# Patient Record
Sex: Female | Born: 1965 | Race: Black or African American | Hispanic: No | Marital: Single | Smoking: Former smoker
Health system: Southern US, Community
[De-identification: ages and names within clinical notes are randomized; demographics above are authoritative.]

## PROBLEM LIST (undated history)

## (undated) DIAGNOSIS — G8929 Other chronic pain: Secondary | ICD-10-CM

## (undated) DIAGNOSIS — M5430 Sciatica, unspecified side: Secondary | ICD-10-CM

## (undated) DIAGNOSIS — F209 Schizophrenia, unspecified: Secondary | ICD-10-CM

## (undated) DIAGNOSIS — F4310 Post-traumatic stress disorder, unspecified: Secondary | ICD-10-CM

## (undated) DIAGNOSIS — F419 Anxiety disorder, unspecified: Secondary | ICD-10-CM

## (undated) DIAGNOSIS — R42 Dizziness and giddiness: Secondary | ICD-10-CM

## (undated) DIAGNOSIS — M549 Dorsalgia, unspecified: Secondary | ICD-10-CM

## (undated) DIAGNOSIS — J45909 Unspecified asthma, uncomplicated: Secondary | ICD-10-CM

## (undated) HISTORY — PX: REVISION OF ABDOMINAL SCAR: SHX2347

## (undated) HISTORY — PX: UMBILICAL HERNIA REPAIR: SHX196

---

## 2016-10-16 ENCOUNTER — Ambulatory Visit (HOSPITAL_COMMUNITY)
Admission: EM | Admit: 2016-10-16 | Discharge: 2016-10-16 | Disposition: A | Discharge status: Home / Self Care | Payer: Medicare (Managed Care) | Attending: Emergency Medicine | Admitting: Emergency Medicine

## 2016-10-16 ENCOUNTER — Encounter (HOSPITAL_COMMUNITY): Payer: Self-pay | Admitting: *Deleted

## 2016-10-16 ENCOUNTER — Ambulatory Visit (INDEPENDENT_AMBULATORY_CARE_PROVIDER_SITE_OTHER): Payer: Medicare (Managed Care)

## 2016-10-16 DIAGNOSIS — J40 Bronchitis, not specified as acute or chronic: Secondary | ICD-10-CM | POA: Diagnosis not present

## 2016-10-16 HISTORY — DX: Unspecified asthma, uncomplicated: J45.909

## 2016-10-16 HISTORY — DX: Anxiety disorder, unspecified: F41.9

## 2016-10-16 HISTORY — DX: Other chronic pain: G89.29

## 2016-10-16 HISTORY — DX: Post-traumatic stress disorder, unspecified: F43.10

## 2016-10-16 HISTORY — DX: Schizophrenia, unspecified: F20.9

## 2016-10-16 HISTORY — DX: Dorsalgia, unspecified: M54.9

## 2016-10-16 HISTORY — DX: Sciatica, unspecified side: M54.30

## 2016-10-16 HISTORY — DX: Dizziness and giddiness: R42

## 2016-10-16 MED ORDER — ALBUTEROL SULFATE HFA 108 (90 BASE) MCG/ACT IN AERS: 2.0000 | INHALATION_SPRAY | RESPIRATORY_TRACT | 0 refills | Status: AC | PRN

## 2016-10-16 MED ORDER — AZITHROMYCIN 250 MG PO TABS: ORAL_TABLET | ORAL | 0 refills | Status: AC

## 2016-10-16 MED ORDER — FLUTICASONE PROPIONATE HFA 110 MCG/ACT IN AERO: 2.0000 | INHALATION_SPRAY | Freq: Two times a day (BID) | RESPIRATORY_TRACT | 0 refills | Status: AC

## 2016-10-16 MED ORDER — PREDNISONE 20 MG PO TABS: ORAL_TABLET | ORAL | 0 refills | Status: AC

## 2016-10-16 MED ORDER — HYDROCOD POLST-CPM POLST ER 10-8 MG/5ML PO SUER: 5.0000 mL | Freq: Two times a day (BID) | ORAL | 0 refills | Status: AC | PRN

## 2016-10-16 NOTE — Discharge Instructions (Signed)
You have bronchitis. °Take azithromycin and prednisone as prescribed. °Use tussionex as needed for cough. °Use the albuterol every 4 hours as needed for wheezing or cough. °You should see improvement in the next 3-5 days. °If you develop fevers, difficulty breathing, or are just not getting better, please come back or go to the emergency room. ° °

## 2016-10-16 NOTE — ED Provider Notes (Signed)
[facility]    CSN: [id number] Arrival date & time: 10/16/16  1212     History   Chief Complaint Chief Complaint  Patient presents with  . Wheezing    HPI Jacqueline SleightUalania Niven is a 51 y.o. female.   HPI  She is a 51 year old woman here for evaluation of cough and wheezing. She reports an asthma exacerbation for the last week. She has completed a course of prednisone with minimal improvement. Over the last 12 hours, symptoms have worsened with increased cough, shortness of breath, body aches, and chills. She also reports sore throat. She has been using her inhalers without improvement. She also reports feeling dizzy, particularly after coughing. She has had some episodes of vomiting.  Past Medical History:  Diagnosis Date  . Anxiety   . Asthma   . Chronic back pain   . PTSD (post-traumatic stress disorder)   . Schizophrenia (HCC)   . Sciatica   . Vertigo     There are no active problems to display for this patient.   Past Surgical History:  Procedure Laterality Date  . REVISION OF ABDOMINAL SCAR    . UMBILICAL HERNIA REPAIR     x2    OB History    No data available       Home Medications    Prior to Admission medications   Medication Sig Start Date End Date Taking? Authorizing Provider  Acetaminophen-Codeine (TYLENOL WITH CODEINE #4 PO) Take by mouth.   Yes Historical Provider, MD  GABAPENTIN PO Take by mouth.   Yes Historical Provider, MD  HYDROCHLOROTHIAZIDE PO Take by mouth.   Yes Historical Provider, MD  LORAZEPAM PO Take by mouth.   Yes Historical Provider, MD  Naproxen (NAPROSYN PO) Take by mouth.   Yes Historical Provider, MD  QUEtiapine Fumarate (SEROQUEL PO) Take by mouth.   Yes Historical Provider, MD  albuterol (PROVENTIL HFA;VENTOLIN HFA) 108 (90 Base) MCG/ACT inhaler Inhale 2 puffs into the lungs every 4 (four) hours as needed for wheezing or shortness of breath. 10/16/16   Charm RingsErin J Yaakov Saindon, MD  azithromycin (ZITHROMAX Z-PAK) 250 MG tablet Take 2  pills today, then 1 pill daily until gone. 10/16/16   Charm RingsErin J Tasha Diaz, MD  chlorpheniramine-HYDROcodone (TUSSIONEX PENNKINETIC ER) 10-8 MG/5ML SUER Take 5 mLs by mouth every 12 (twelve) hours as needed for cough. 10/16/16   Charm RingsErin J Amandy Chubbuck, MD  fluticasone (FLOVENT HFA) 110 MCG/ACT inhaler Inhale 2 puffs into the lungs 2 (two) times daily. 10/16/16   Charm RingsErin J Katara Griner, MD  predniSONE (DELTASONE) 20 MG tablet Take 3 tablets for 5 days, then 2 for 3 days, then 1 for 3 days, then 1/2 for 3 days. 10/16/16   Charm RingsErin J Cristobal Advani, MD    Family History No family history on file.  Social History Social History  Substance Use Topics  . Smoking status: Former Games developermoker  . Smokeless tobacco: Not on file  . Alcohol use Yes     Comment: occasional     Allergies   Motrin [ibuprofen]   Review of Systems Review of Systems As in history of present illness  Physical Exam Triage Vital Signs ED Triage Vitals  Enc Vitals Group     BP 10/16/16 1333 117/72     Pulse Rate 10/16/16 1333 112     Resp 10/16/16 1333 26     Temp 10/16/16 1333 98.9 F (37.2 C)     Temp Source 10/16/16 1333 Oral     SpO2 10/16/16 1333 99 %  Weight --      Height --      Head Circumference --      Peak Flow --      Pain Score 10/16/16 1339 8     Pain Loc --      Pain Edu? --      Excl. in GC? --    No data found.   Updated Vital Signs BP 117/72   Pulse 112   Temp 98.9 F (37.2 C) (Oral)   Resp 26   LMP 09/30/2016 (Exact Date)   SpO2 99%   Visual Acuity Right Eye Distance:   Left Eye Distance:   Bilateral Distance:    Right Eye Near:   Left Eye Near:    Bilateral Near:     Physical Exam  Constitutional: She is oriented to person, place, and time. She appears well-developed and well-nourished. No distress.  HENT:  Mouth/Throat: Oropharynx is clear and moist. No oropharyngeal exudate.  Nasal mucosa erythematous  Neck: Neck supple.  Cardiovascular: Regular rhythm and normal heart sounds.   No murmur heard. Mild  tachycardia  Pulmonary/Chest: Effort normal and breath sounds normal. No respiratory distress. She has no wheezes. She has no rales.  Lymphadenopathy:    She has no cervical adenopathy.  Neurological: She is alert and oriented to person, place, and time.     UC Treatments / Results  Labs (all labs ordered are listed, but only abnormal results are displayed) Labs Reviewed - No data to display  EKG  EKG Interpretation None       Radiology No results found.  Procedures Procedures (including critical care time)  Medications Ordered in UC Medications - No data to display   Initial Impression / Assessment and Plan / UC Course  I have reviewed the triage vital signs and the nursing notes.  Pertinent labs & imaging results that were available during my care of the patient were reviewed by me and considered in my medical decision making (see chart for details).     We'll treat with azithromycin and prednisone taper. Tussionex as needed for cough. I have also provided refills of her inhalers. Follow-up as needed.  Final Clinical Impressions(s) / UC Diagnoses   Final diagnoses:  Bronchitis    New Prescriptions New Prescriptions   ALBUTEROL (PROVENTIL HFA;VENTOLIN HFA) 108 (90 BASE) MCG/ACT INHALER    Inhale 2 puffs into the lungs every 4 (four) hours as needed for wheezing or shortness of breath.   AZITHROMYCIN (ZITHROMAX Z-PAK) 250 MG TABLET    Take 2 pills today, then 1 pill daily until gone.   CHLORPHENIRAMINE-HYDROCODONE (TUSSIONEX PENNKINETIC ER) 10-8 MG/5ML SUER    Take 5 mLs by mouth every 12 (twelve) hours as needed for cough.   FLUTICASONE (FLOVENT HFA) 110 MCG/ACT INHALER    Inhale 2 puffs into the lungs 2 (two) times daily.   PREDNISONE (DELTASONE) 20 MG TABLET    Take 3 tablets for 5 days, then 2 for 3 days, then 1 for 3 days, then 1/2 for 3 days.     Charm Rings, MD 10/16/16 1434

## 2016-10-16 NOTE — ED Triage Notes (Signed)
Pt visiting from out of town.  Is currently finishing Prednisone taper for asthma.  Has been using albuterol and Flovent without any relief.  Approx 12 hrs ago started not feeling well with wheezing, burning in chest, chills.

## 2017-10-06 IMAGING — DX DG CHEST 2V
2 series · 2 of 2 positions shown · non-contrast
Comparison: None.

CLINICAL DATA: Cough, wheezing, low-grade fever, and chest pain for
3 weeks. Asthma.

EXAM:
CHEST  2 VIEW

[chest pa]
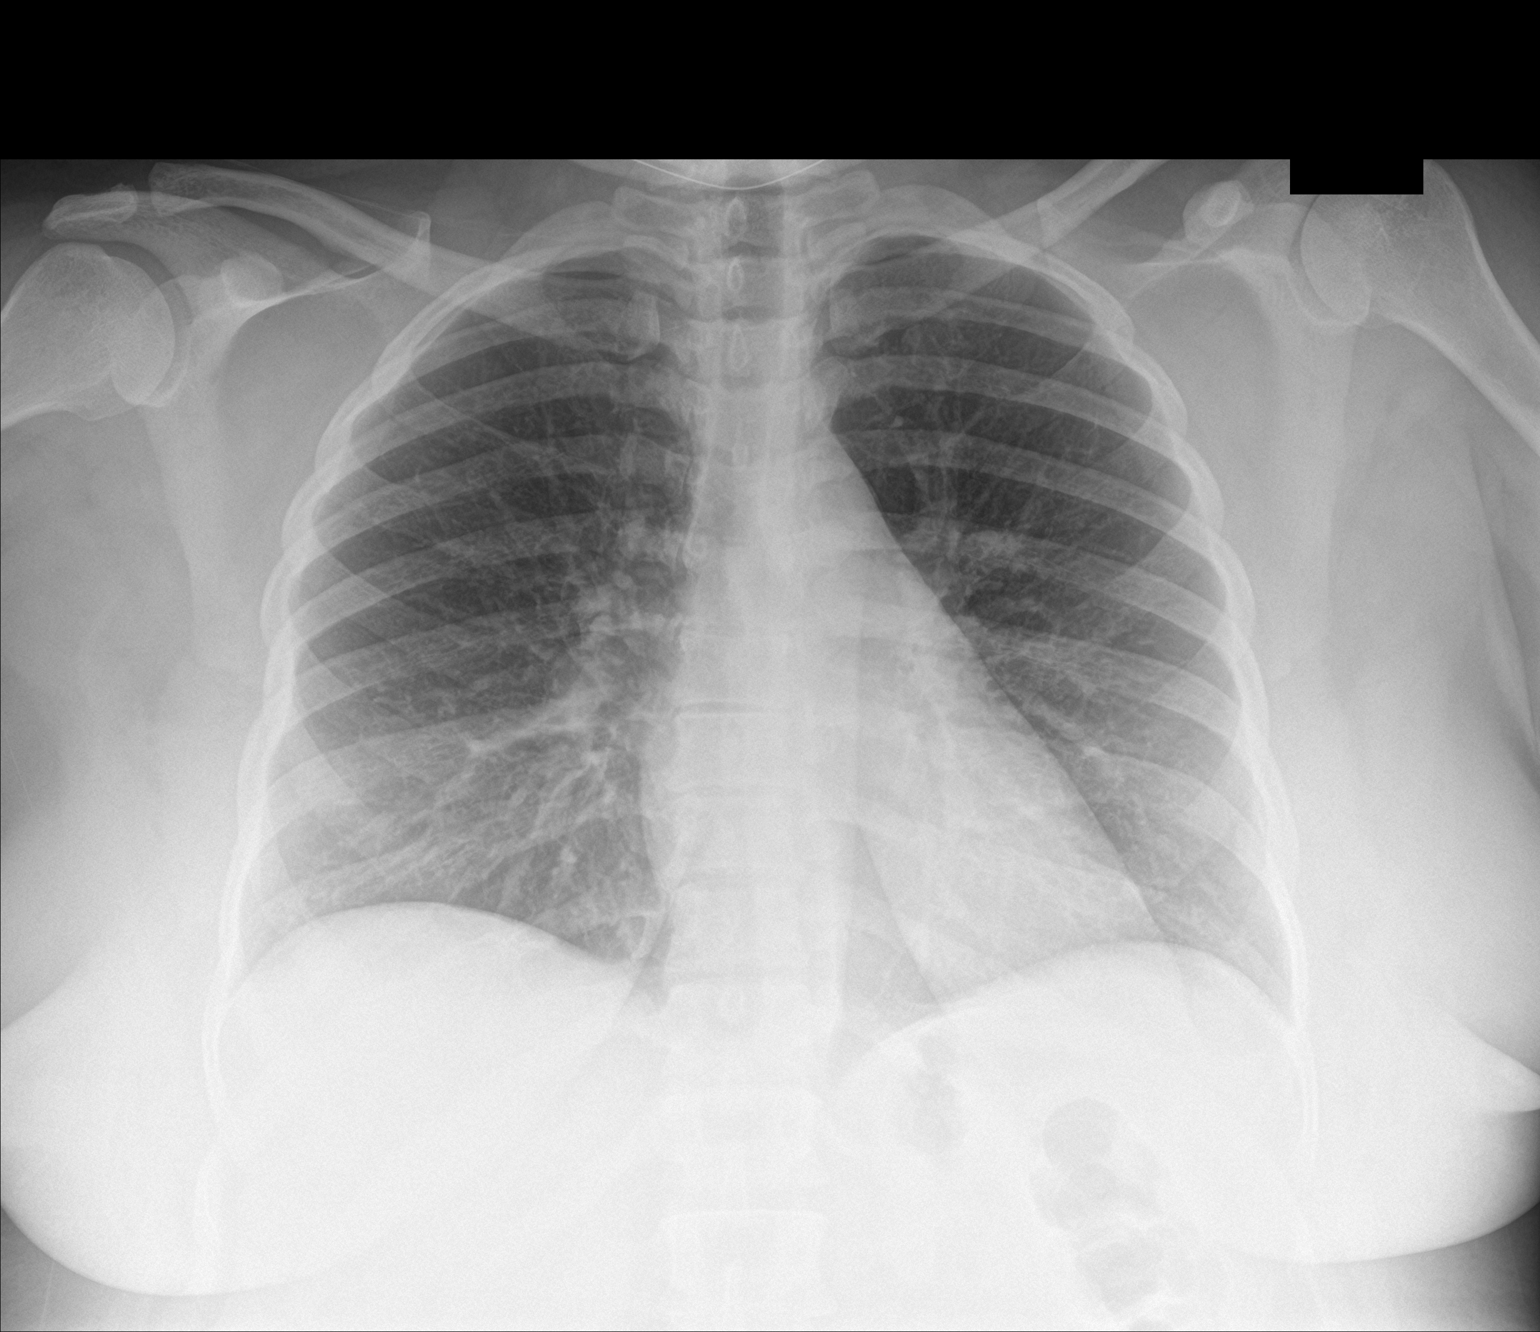

[chest lat]
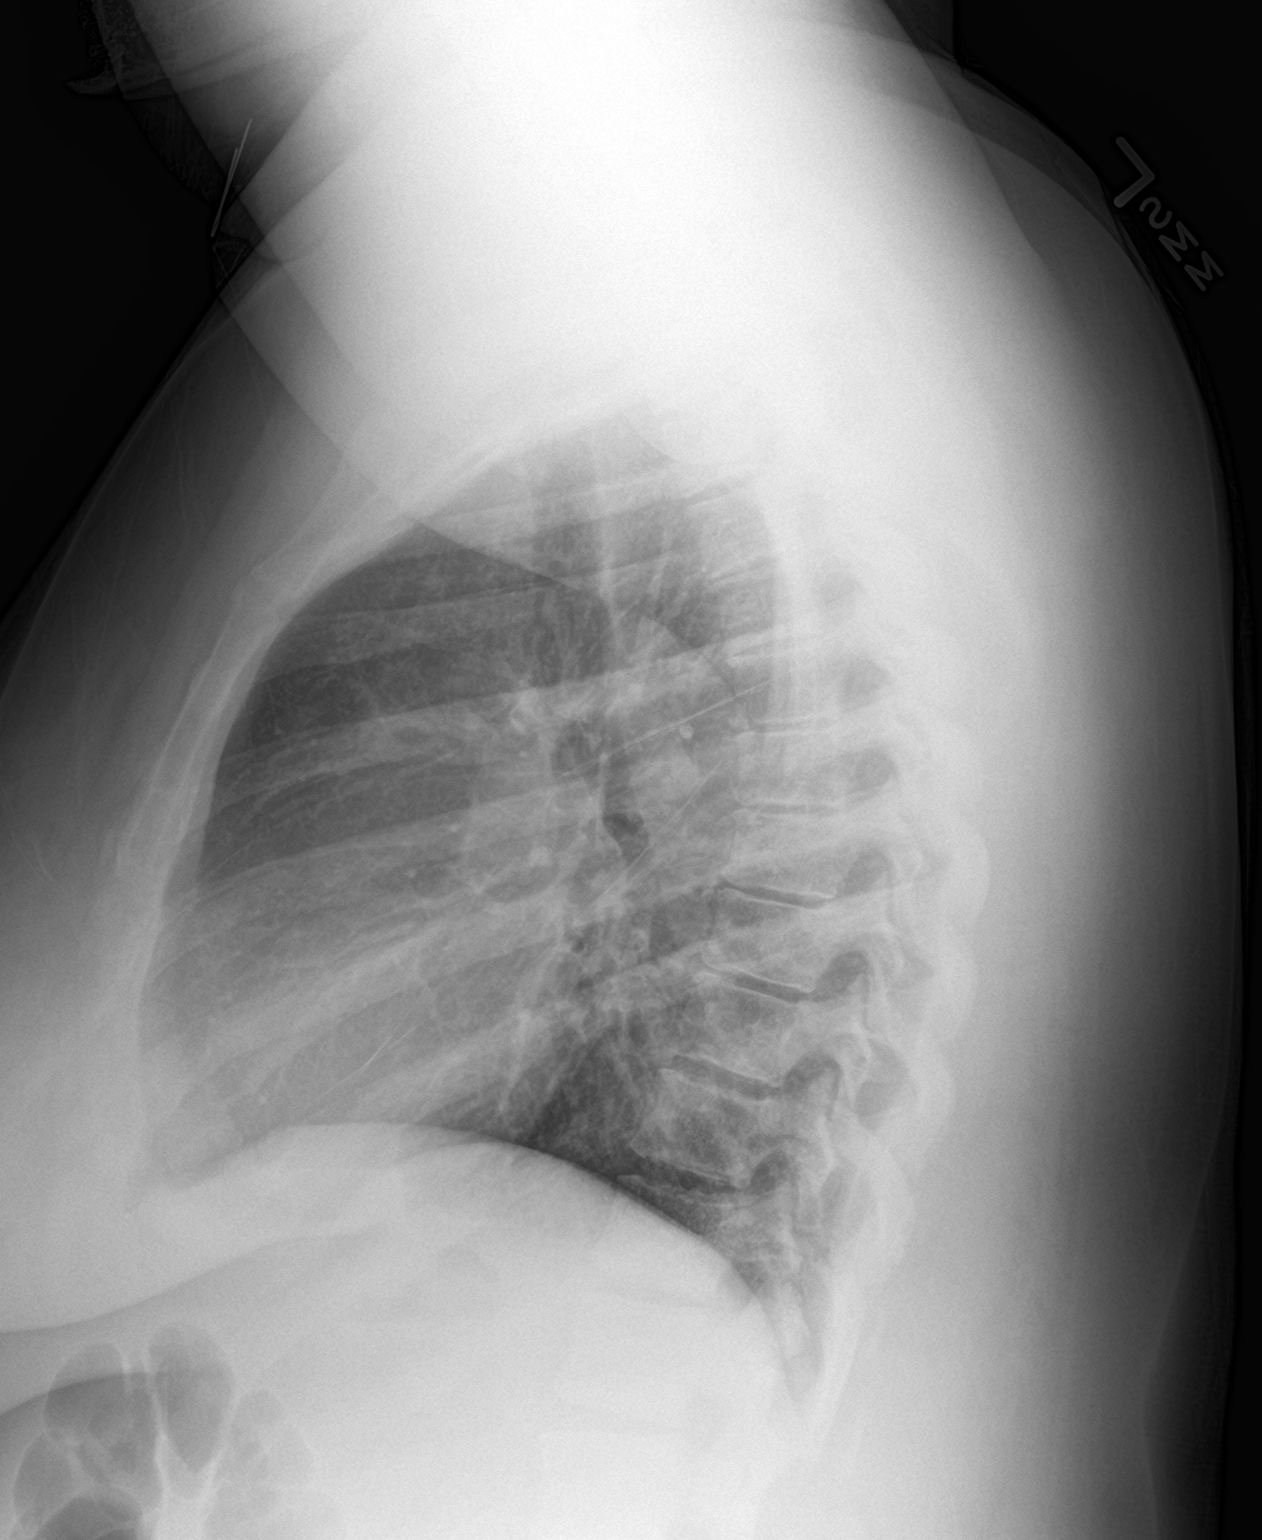

[2 of 2 positions shown; findings below may reference images not displayed]

FINDINGS: The heart size and mediastinal contours are within normal limits.
Both lungs are clear. The visualized skeletal structures are
unremarkable.
IMPRESSION: No active cardiopulmonary disease.
